# Patient Record
Sex: Female | Born: 1965 | Race: Black or African American | Hispanic: No | Marital: Married | Smoking: Never smoker
Health system: Southern US, Community
[De-identification: ages and names within clinical notes are randomized; demographics above are authoritative.]

## PROBLEM LIST (undated history)

## (undated) DIAGNOSIS — E119 Type 2 diabetes mellitus without complications: Secondary | ICD-10-CM

## (undated) DIAGNOSIS — I10 Essential (primary) hypertension: Secondary | ICD-10-CM

## (undated) HISTORY — PX: ABDOMINAL HYSTERECTOMY: SHX81

---

## 2004-04-03 ENCOUNTER — Emergency Department: Payer: Self-pay | Admitting: Emergency Medicine

## 2015-02-25 ENCOUNTER — Encounter: Payer: Self-pay | Admitting: Intensive Care

## 2015-02-25 ENCOUNTER — Emergency Department
Admission: EM | Admit: 2015-02-25 | Discharge: 2015-02-25 | Disposition: A | Discharge status: Home / Self Care | Payer: BC Managed Care – PPO | Attending: Student | Admitting: Student

## 2015-02-25 ENCOUNTER — Emergency Department: Payer: BC Managed Care – PPO

## 2015-02-25 DIAGNOSIS — E119 Type 2 diabetes mellitus without complications: Secondary | ICD-10-CM | POA: Diagnosis not present

## 2015-02-25 DIAGNOSIS — Y92410 Unspecified street and highway as the place of occurrence of the external cause: Secondary | ICD-10-CM | POA: Insufficient documentation

## 2015-02-25 DIAGNOSIS — I10 Essential (primary) hypertension: Secondary | ICD-10-CM | POA: Insufficient documentation

## 2015-02-25 DIAGNOSIS — Y998 Other external cause status: Secondary | ICD-10-CM | POA: Diagnosis not present

## 2015-02-25 DIAGNOSIS — S161XXA Strain of muscle, fascia and tendon at neck level, initial encounter: Secondary | ICD-10-CM

## 2015-02-25 DIAGNOSIS — Y9389 Activity, other specified: Secondary | ICD-10-CM | POA: Insufficient documentation

## 2015-02-25 DIAGNOSIS — S199XXA Unspecified injury of neck, initial encounter: Secondary | ICD-10-CM | POA: Diagnosis present

## 2015-02-25 HISTORY — DX: Type 2 diabetes mellitus without complications: E11.9

## 2015-02-25 HISTORY — DX: Essential (primary) hypertension: I10

## 2015-02-25 LAB — URINALYSIS COMPLETE WITH MICROSCOPIC (ARMC ONLY)
BILIRUBIN URINE: NEGATIVE
Glucose, UA: >500 mg/dL — AB
HGB URINE DIPSTICK: NEGATIVE
KETONES UR: NEGATIVE mg/dL
LEUKOCYTES UA: NEGATIVE
NITRITE: NEGATIVE
PH: 7 (ref 5.0–8.0)
Protein, ur: NEGATIVE mg/dL
RBC / HPF: NONE SEEN RBC/hpf (ref 0–5)
SPECIFIC GRAVITY, URINE: 1.014 (ref 1.005–1.030)

## 2015-02-25 LAB — GLUCOSE, CAPILLARY: Glucose-Capillary: 371 mg/dL — ABNORMAL HIGH (ref 65–99)

## 2015-02-25 MED ORDER — KETOROLAC TROMETHAMINE 30 MG/ML IJ SOLN
30.0000 mg | Freq: Once | INTRAMUSCULAR | Status: AC
  Administered 2015-02-25: 30 mg via INTRAVENOUS
  Filled 2015-02-25: qty 1

## 2015-02-25 MED ORDER — IBUPROFEN 800 MG PO TABS: 800.0000 mg | ORAL_TABLET | Freq: Three times a day (TID) | ORAL | Status: AC | PRN

## 2015-02-25 MED ORDER — CYCLOBENZAPRINE HCL 10 MG PO TABS: 10.0000 mg | ORAL_TABLET | Freq: Three times a day (TID) | ORAL | Status: AC | PRN

## 2015-02-25 MED ORDER — KETOROLAC TROMETHAMINE 60 MG/2ML IM SOLN
60.0000 mg | Freq: Once | INTRAMUSCULAR | Status: DC
  Filled 2015-02-25: qty 2
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
## 2015-02-25 NOTE — Discharge Instructions (Signed)
Cervical Sprain °A cervical sprain is an injury in the neck in which the strong, fibrous tissues (ligaments) that connect your neck bones stretch or tear. Cervical sprains can range from mild to severe. Severe cervical sprains can cause the neck vertebrae to be unstable. This can lead to damage of the spinal cord and can result in serious nervous system problems. The amount of time it takes for a cervical sprain to get better depends on the cause and extent of the injury. Most cervical sprains heal in 1 to 3 weeks. °CAUSES  °Severe cervical sprains may be caused by:  °· Contact sport injuries (such as from football, rugby, wrestling, hockey, auto racing, gymnastics, diving, martial arts, or boxing).   °· Motor vehicle collisions.   °· Whiplash injuries. This is an injury from a sudden forward and backward whipping movement of the head and neck.  °· Falls.   °Mild cervical sprains may be caused by:  °· Being in an awkward position, such as while cradling a telephone between your ear and shoulder.   °· Sitting in a chair that does not offer proper support.   °· Working at a poorly designed computer station.   °· Looking up or down for long periods of time.   °SYMPTOMS  °· Pain, soreness, stiffness, or a burning sensation in the front, back, or sides of the neck. This discomfort may develop immediately after the injury or slowly, 24 hours or more after the injury.   °· Pain or tenderness directly in the middle of the back of the neck.   °· Shoulder or upper back pain.   °· Limited ability to move the neck.   °· Headache.   °· Dizziness.   °· Weakness, numbness, or tingling in the hands or arms.   °· Muscle spasms.   °· Difficulty swallowing or chewing.   °· Tenderness and swelling of the neck.   °DIAGNOSIS  °Most of the time your health care provider can diagnose a cervical sprain by taking your history and doing a physical exam. Your health care provider will ask about previous neck injuries and any known neck  problems, such as arthritis in the neck. X-rays may be taken to find out if there are any other problems, such as with the bones of the neck. Other tests, such as a CT scan or MRI, may also be needed.  °TREATMENT  °Treatment depends on the severity of the cervical sprain. Mild sprains can be treated with rest, keeping the neck in place (immobilization), and pain medicines. Severe cervical sprains are immediately immobilized. Further treatment is done to help with pain, muscle spasms, and other symptoms and may include: °· Medicines, such as pain relievers, numbing medicines, or muscle relaxants.   °· Physical therapy. This may involve stretching exercises, strengthening exercises, and posture training. Exercises and improved posture can help stabilize the neck, strengthen muscles, and help stop symptoms from returning.   °HOME CARE INSTRUCTIONS  °· Put ice on the injured area.   °¨ Put ice in a plastic bag.   °¨ Place a towel between your skin and the bag.   °¨ Leave the ice on for 15-20 minutes, 3-4 times a day.   °· If your injury was severe, you may have been given a cervical collar to wear. A cervical collar is a two-piece collar designed to keep your neck from moving while it heals. °¨ Do not remove the collar unless instructed by your health care provider. °¨ If you have long hair, keep it outside of the collar. °¨ Ask your health care provider before making any adjustments to your collar. Minor   adjustments may be required over time to improve comfort and reduce pressure on your chin or on the back of your head. °¨ If you are allowed to remove the collar for cleaning or bathing, follow your health care provider's instructions on how to do so safely. °¨ Keep your collar clean by wiping it with mild soap and water and drying it completely. If the collar you have been given includes removable pads, remove them every 1-2 days and hand wash them with soap and water. Allow them to air dry. They should be completely  dry before you wear them in the collar. °¨ If you are allowed to remove the collar for cleaning and bathing, wash and dry the skin of your neck. Check your skin for irritation or sores. If you see any, tell your health care provider. °¨ Do not drive while wearing the collar.   °· Only take over-the-counter or prescription medicines for pain, discomfort, or fever as directed by your health care provider.   °· Keep all follow-up appointments as directed by your health care provider.   °· Keep all physical therapy appointments as directed by your health care provider.   °· Make any needed adjustments to your workstation to promote good posture.   °· Avoid positions and activities that make your symptoms worse.   °· Warm up and stretch before being active to help prevent problems.   °SEEK MEDICAL CARE IF:  °· Your pain is not controlled with medicine.   °· You are unable to decrease your pain medicine over time as planned.   °· Your activity level is not improving as expected.   °SEEK IMMEDIATE MEDICAL CARE IF:  °· You develop any bleeding. °· You develop stomach upset. °· You have signs of an allergic reaction to your medicine.   °· Your symptoms get worse.   °· You develop new, unexplained symptoms.   °· You have numbness, tingling, weakness, or paralysis in any part of your body.   °MAKE SURE YOU:  °· Understand these instructions. °· Will watch your condition. °· Will get help right away if you are not doing well or get worse. °Document Released: 03/08/2007 Document Revised: 05/16/2013 Document Reviewed: 11/16/2012 °[employer]® Patient Information ©2015 [employer]. This information is not intended to replace advice given to you by your health care provider. Make sure you discuss any questions you have with your health care provider. ° °Motor Vehicle Collision °It is common to have multiple bruises and sore muscles after a motor vehicle collision (MVC). These tend to feel worse for the first 24 hours. You may have  the most stiffness and soreness over the first several hours. You may also feel worse when you wake up the first morning after your collision. After this point, you will usually begin to improve with each day. The speed of improvement often depends on the severity of the collision, the number of injuries, and the location and nature of these injuries. °HOME CARE INSTRUCTIONS °· Put ice on the injured area. °¨ Put ice in a plastic bag. °¨ Place a towel between your skin and the bag. °¨ Leave the ice on for 15-20 minutes, 3-4 times a day, or as directed by your health care provider. °· Drink enough fluids to keep your urine clear or pale yellow. Do not drink alcohol. °· Take a warm shower or bath once or twice a day. This will increase blood flow to sore muscles. °· You may return to activities as directed by your caregiver. Be careful when lifting, as this may aggravate neck or back   pain. °· Only take over-the-counter or prescription medicines for pain, discomfort, or fever as directed by your caregiver. Do not use aspirin. This may increase bruising and bleeding. °SEEK IMMEDIATE MEDICAL CARE IF: °· You have numbness, tingling, or weakness in the arms or legs. °· You develop severe headaches not relieved with medicine. °· You have severe neck pain, especially tenderness in the middle of the back of your neck. °· You have changes in bowel or bladder control. °· There is increasing pain in any area of the body. °· You have shortness of breath, light-headedness, dizziness, or fainting. °· You have chest pain. °· You feel sick to your stomach (nauseous), throw up (vomit), or sweat. °· You have increasing abdominal discomfort. °· There is blood in your urine, stool, or vomit. °· You have pain in your shoulder (shoulder strap areas). °· You feel your symptoms are getting worse. °MAKE SURE YOU: °· Understand these instructions. °· Will watch your condition. °· Will get help right away if you are not doing well or get  worse. °Document Released: 05/11/2005 Document Revised: 09/25/2013 Document Reviewed: 10/08/2010 °[employer]® Patient Information ©2015 [employer]. This information is not intended to replace advice given to you by your health care provider. Make sure you discuss any questions you have with your health care provider. ° °
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
